# Patient Record
Sex: Female | Born: 1965 | Hispanic: Yes | Marital: Single
Health system: Southern US, Community
[De-identification: ages and names within clinical notes are randomized; demographics above are authoritative.]

## PROBLEM LIST (undated history)

## (undated) HISTORY — PX: ABDOMINAL HYSTERECTOMY: SHX81

---

## 2008-02-14 ENCOUNTER — Ambulatory Visit: Payer: Self-pay

## 2009-04-06 ENCOUNTER — Emergency Department: Payer: Self-pay | Admitting: Internal Medicine

## 2009-04-12 ENCOUNTER — Ambulatory Visit: Payer: Self-pay | Admitting: Obstetrics and Gynecology

## 2009-04-15 ENCOUNTER — Ambulatory Visit: Payer: Self-pay | Admitting: Obstetrics and Gynecology

## 2020-03-28 ENCOUNTER — Emergency Department: Payer: Self-pay

## 2020-03-28 ENCOUNTER — Emergency Department
Admission: EM | Admit: 2020-03-28 | Discharge: 2020-03-29 | Disposition: A | Discharge status: Home / Self Care | Payer: Self-pay | Attending: Emergency Medicine | Admitting: Emergency Medicine

## 2020-03-28 ENCOUNTER — Other Ambulatory Visit: Payer: Self-pay

## 2020-03-28 DIAGNOSIS — N390 Urinary tract infection, site not specified: Secondary | ICD-10-CM | POA: Insufficient documentation

## 2020-03-28 LAB — CBC WITH DIFFERENTIAL/PLATELET
Abs Immature Granulocytes: 0.06 10*3/uL (ref 0.00–0.07)
Basophils Absolute: 0 10*3/uL (ref 0.0–0.1)
Basophils Relative: 0 %
Eosinophils Absolute: 0 10*3/uL (ref 0.0–0.5)
Eosinophils Relative: 0 %
HCT: 37.3 % (ref 36.0–46.0)
Hemoglobin: 12.3 g/dL (ref 12.0–15.0)
Immature Granulocytes: 0 %
Lymphocytes Relative: 6 %
Lymphs Abs: 0.9 10*3/uL (ref 0.7–4.0)
MCH: 28 pg (ref 26.0–34.0)
MCHC: 33 g/dL (ref 30.0–36.0)
MCV: 84.8 fL (ref 80.0–100.0)
Monocytes Absolute: 1.3 10*3/uL — ABNORMAL HIGH (ref 0.1–1.0)
Monocytes Relative: 9 %
Neutro Abs: 12.1 10*3/uL — ABNORMAL HIGH (ref 1.7–7.7)
Neutrophils Relative %: 85 %
Platelets: 288 10*3/uL (ref 150–400)
RBC: 4.4 MIL/uL (ref 3.87–5.11)
RDW: 13.1 % (ref 11.5–15.5)
WBC: 14.5 10*3/uL — ABNORMAL HIGH (ref 4.0–10.5)
nRBC: 0 % (ref 0.0–0.2)

## 2020-03-28 LAB — URINALYSIS, COMPLETE (UACMP) WITH MICROSCOPIC
Bacteria, UA: NONE SEEN
Bilirubin Urine: NEGATIVE
Glucose, UA: NEGATIVE mg/dL
Ketones, ur: NEGATIVE mg/dL
Nitrite: NEGATIVE
Protein, ur: 100 mg/dL — AB
Specific Gravity, Urine: 1.013 (ref 1.005–1.030)
Squamous Epithelial / HPF: >50 — ABNORMAL HIGH (ref 0–5)
WBC, UA: >50 WBC/hpf — ABNORMAL HIGH (ref 0–5)
pH: 7 (ref 5.0–8.0)

## 2020-03-28 LAB — COMPREHENSIVE METABOLIC PANEL
ALT: 44 U/L (ref 0–44)
AST: 40 U/L (ref 15–41)
Albumin: 3.4 g/dL — ABNORMAL LOW (ref 3.5–5.0)
Alkaline Phosphatase: 82 U/L (ref 38–126)
Anion gap: 12 (ref 5–15)
BUN: 15 mg/dL (ref 6–20)
CO2: 23 mmol/L (ref 22–32)
Calcium: 8.4 mg/dL — ABNORMAL LOW (ref 8.9–10.3)
Chloride: 101 mmol/L (ref 98–111)
Creatinine, Ser: 1.39 mg/dL — ABNORMAL HIGH (ref 0.44–1.00)
GFR, Estimated: 45 mL/min — ABNORMAL LOW (ref >60)
Glucose, Bld: 117 mg/dL — ABNORMAL HIGH (ref 70–99)
Potassium: 3.3 mmol/L — ABNORMAL LOW (ref 3.5–5.1)
Sodium: 136 mmol/L (ref 135–145)
Total Bilirubin: 1 mg/dL (ref 0.3–1.2)
Total Protein: 8.2 g/dL — ABNORMAL HIGH (ref 6.5–8.1)

## 2020-03-28 LAB — LACTIC ACID, PLASMA: Lactic Acid, Venous: 1 mmol/L (ref 0.5–1.9)

## 2020-03-28 MED ORDER — ACETAMINOPHEN 500 MG PO TABS
1000.0000 mg | ORAL_TABLET | Freq: Once | ORAL | Status: AC
  Administered 2020-03-28: 1000 mg via ORAL
  Filled 2020-03-28: qty 2

## 2020-03-28 MED ORDER — SODIUM CHLORIDE 0.9 % IV SOLN
1.0000 g | Freq: Once | INTRAVENOUS | Status: AC
  Administered 2020-03-28: 1 g via INTRAVENOUS
  Filled 2020-03-28: qty 10

## 2020-03-28 MED ORDER — ONDANSETRON 4 MG PO TBDP
4.0000 mg | ORAL_TABLET | Freq: Once | ORAL | Status: AC
  Administered 2020-03-28: 4 mg via ORAL

## 2020-03-28 MED ORDER — ONDANSETRON 4 MG PO TBDP
ORAL_TABLET | ORAL | Status: AC
  Filled 2020-03-28: qty 1

## 2020-03-28 MED ORDER — IBUPROFEN 600 MG PO TABS
600.0000 mg | ORAL_TABLET | Freq: Once | ORAL | Status: AC
  Administered 2020-03-28: 600 mg via ORAL
  Filled 2020-03-28: qty 1

## 2020-03-28 NOTE — ED Provider Notes (Signed)
[facility] Emergency Department Provider Note  Time seen: 11:16 PM  I have reviewed the triage vital signs and the nursing notes. Spanish interpreter used for this evaluation.  HISTORY  Chief Complaint Fever   HPI Mary Salas is a 54 y.o. female with no significant past medical history presents to the emergency department for 5 days of fever and not feeling well.  According to the patient she was diagnosed with Covid approximately 1 month ago, states she felt better after she recovered and took a test approximately 2 weeks later and was negative.  Patient states her only symptom she still has is an occasional dry cough.  Denies any chest pain.  No shortness of breath.  However approximately 5 days ago she began feeling nauseated with intermittent high fever at home.  Patient has noticed dark urine but denies any dysuria.  States nausea with occasional vomiting but denies any diarrhea.  No abdominal pain.   No past medical history on file.  There are no problems to display for this patient.   Prior to Admission medications   Not on File    No Known Allergies  No family history on file.  Social History Social History   Tobacco Use  . Smoking status: Not on file  Substance Use Topics  . Alcohol use: Not on file  . Drug use: Not on file    Review of Systems Constitutional: Intermittent fever over the past 5 days ENT: Negative for recent illness/congestion Cardiovascular: Negative for chest pain. Respiratory: Negative for shortness of breath.  Occasional dry cough x1 month Gastrointestinal: Negative for abdominal pain.  Positive for nausea occasional vomiting. Genitourinary: Dark urine without dysuria. Musculoskeletal: Negative for musculoskeletal complaints Neurological: Negative for headache All other ROS negative  ____________________________________________   PHYSICAL EXAM:  VITAL SIGNS: ED Triage Vitals [03/28/20 1953]  Enc  Vitals Group     BP (!) 156/102     Pulse Rate (!) 124     Resp 16     Temp (!) 100.6 F (38.1 C)     Temp Source Oral     SpO2 97 %     Weight      Height      Head Circumference      Peak Flow      Pain Score 0     Pain Loc      Pain Edu?      Excl. in GC?    Constitutional: Alert and oriented. Well appearing and in no distress. Eyes: Normal exam ENT      Head: Normocephalic and atraumatic.      Mouth/Throat: Mucous membranes are moist. Cardiovascular: Normal rate, regular rhythm Respiratory: Normal respiratory effort without tachypnea nor retractions. Breath sounds are clear Gastrointestinal: Soft and nontender. No distention.  There is no CVA tenderness. Musculoskeletal: Nontender with normal range of motion in all extremities.  Neurologic:  Normal speech and language. No gross focal neurologic deficits Skin:  Skin is warm, dry and intact.  Psychiatric: Mood and affect are normal.  ____________________________________________    EKG  EKG viewed and interpreted by myself shows sinus tachycardia 110 bpm with a narrow QRS, normal axis, normal intervals, no concerning ST changes.  ____________________________________________    RADIOLOGY  Chest x-ray shows bilateral opacities consistent with Covid  ____________________________________________   INITIAL IMPRESSION / ASSESSMENT AND PLAN / ED COURSE  Pertinent labs & imaging results that were available during my care of the patient were reviewed  by me and considered in my medical decision making (see chart for details).   Patient presents emergency department for fever generalized weakness occasional nausea vomiting.  Overall the patient appears quite well.  She is tachycardic and febrile.  Patient appears to have a fairly significant urinary tract infection on her lab work.  Chest x-ray continues to show bilateral opacities however given her recent Covid infection this is to be expected.  As the patient improved from  Covid over the past 2 weeks and is now having fever body aches and a significant urinary tract infection on her work-up I believe her current illness is more likely due to UTI than Covid.  Patient is febrile and mildly tachycardic we will dose Tylenol and reassess.  We will dose IV fluids.  I have sent urine culture we will dose IV Rocephin and reassess at that time.  Reassuringly patient's lactic acid is 1.  Patient states she is feeling much better after fluids and antibiotics as well as Tylenol.  Daughter is here with the patient as well.  I offered the patient admission to the hospital.  She would rather go home.  I believe this is a reasonable option as well we will discharge with antibiotics I discussed using Tylenol/ibuprofen every 6 hours for fever as well as plenty of fluids and rest.  I also discussed very strict return precautions if she is to worsen or begin vomiting unable to keep down antibiotics.  Patient agreeable to plan of care.  Mary Salas was evaluated in Emergency Department on 03/28/2020 for the symptoms described in the history of present illness. She was evaluated in the context of the global COVID-19 pandemic, which necessitated consideration that the patient might be at risk for infection with the SARS-CoV-2 virus that causes COVID-19. Institutional protocols and algorithms that pertain to the evaluation of patients at risk for COVID-19 are in a state of rapid change based on information released by regulatory bodies including the CDC and federal and state organizations. These policies and algorithms were followed during the patient's care in the ED.  ____________________________________________   FINAL CLINICAL IMPRESSION(S) / ED DIAGNOSES  fever Urinary tract infection   Minna Antis, MD 03/29/20 [id number]

## 2020-03-28 NOTE — ED Notes (Signed)
Pt states she has had a fever and nausea/vomiting for five days. Pt states she's been taking tylenol at home and has still had chills. Denies pain.

## 2020-03-28 NOTE — ED Triage Notes (Addendum)
Pt to ED reporting fever starting 5 days ago with generalized body aches. Nausea without vomiting or diarrhea. No urinary changes. Nonproductive cough x 5 days as well. No SOB or chest pain.  Temp at home reportedly 99.3, pt took tylenol at 1500 today.  Pt tested positive for COVID 4 weeks ago.

## 2020-03-29 MED ORDER — CEPHALEXIN 500 MG PO CAPS: 500.0000 mg | ORAL_CAPSULE | Freq: Three times a day (TID) | ORAL | 0 refills | Status: AC

## 2020-03-29 NOTE — Discharge Instructions (Signed)
Please take antibiotics as prescribed for their entire course.  Please use Tylenol or ibuprofen every 6 hours as needed for fever or discomfort.  Return to the emergency department if you are unable to keep down your antibiotics, develop abdominal pain, or if your symptoms appear to worsen.  Otherwise please follow-up with your primary care doctor in the next 2 to 3 days for recheck/reevaluation.

## 2020-03-30 LAB — URINE CULTURE: Culture: 70000 — AB

## 2020-07-31 ENCOUNTER — Other Ambulatory Visit: Payer: Self-pay

## 2020-07-31 ENCOUNTER — Ambulatory Visit: Payer: Self-pay | Attending: Oncology

## 2020-07-31 ENCOUNTER — Ambulatory Visit
Admission: RE | Admit: 2020-07-31 | Discharge: 2020-07-31 | Disposition: A | Discharge status: Home / Self Care | Payer: Self-pay | Source: Ambulatory Visit | Attending: Oncology | Admitting: Oncology

## 2020-07-31 VITALS — BP 152/97 | HR 103 | Temp 97.8°F | Ht 60.63 in | Wt 175.8 lb

## 2020-07-31 DIAGNOSIS — Z0000 Encounter for general adult medical examination without abnormal findings: Secondary | ICD-10-CM

## 2020-07-31 NOTE — Progress Notes (Signed)
  Subjective:     Patient ID: Mary Salas, female   DOB: [birth year], 55 y.o.   MRN: [id number]  HPI   Review of Systems     Objective:   Physical Exam Chest:  Breasts:     Right: No swelling, bleeding, inverted nipple, mass, nipple discharge, skin change or tenderness.     Left: No swelling, bleeding, inverted nipple, mass, nipple discharge, skin change or tenderness.          Assessment:     55 year old patient presents for [facility] clinic visit. Partial hysterectomy in 2010.  Ovaries intact.  Patient screened, and meets [facility] eligibility.  Patient does not have insurance, Medicare or Medicaid. Instructed patient on breast self awareness using teach back method.  Clinical breast exam unremarkable.  No mass or lump palpated.   Risk Assessment    Risk Scores      07/31/2020   Last edited by: Jim Like, RN   5-year risk: 0.8 %   Lifetime risk: 6.6 %            Plan:     Sent for bilateral screening mammogram.

## 2020-10-14 NOTE — Progress Notes (Signed)
Letter mailed from [facility] to notify of normal mammogram results.  Patient to return in one year for annual screening.  Copy to [facility]. 

## 2022-07-18 IMAGING — MG MM DIGITAL SCREENING BILAT W/ TOMO AND CAD
8 series · 8 of 24 positions shown · non-contrast
Comparison: Previous exam(s).

CLINICAL DATA: Screening.

EXAM:
DIGITAL SCREENING BILATERAL MAMMOGRAM WITH TOMOSYNTHESIS AND CAD
TECHNIQUE: Bilateral screening digital craniocaudal and mediolateral oblique
mammograms were obtained. Bilateral screening digital breast
tomosynthesis was performed. The images were evaluated with
computer-aided detection.

[R CC synth-2D]
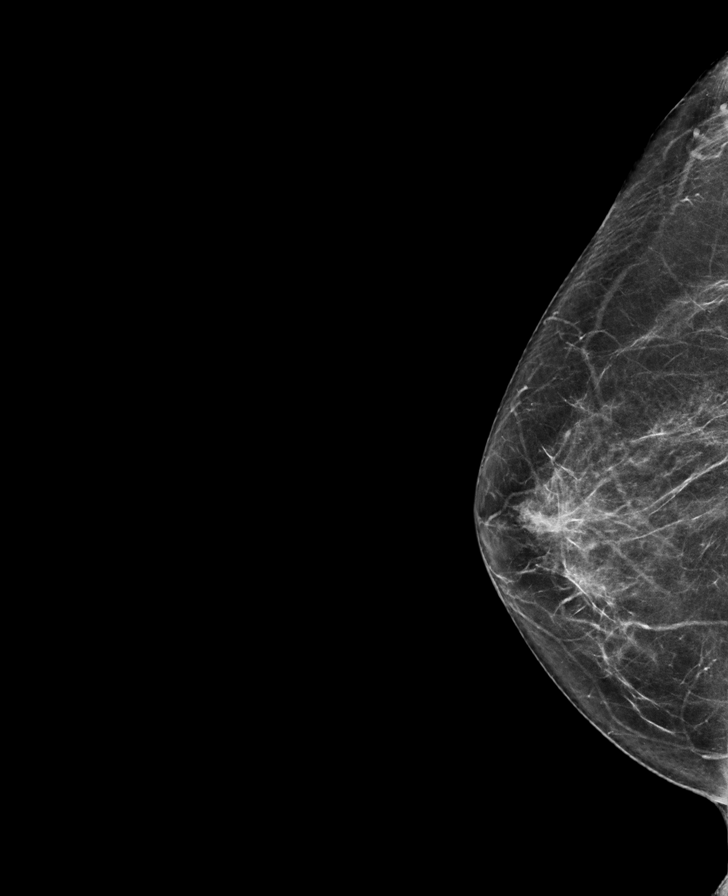

[R MLO synth-2D]
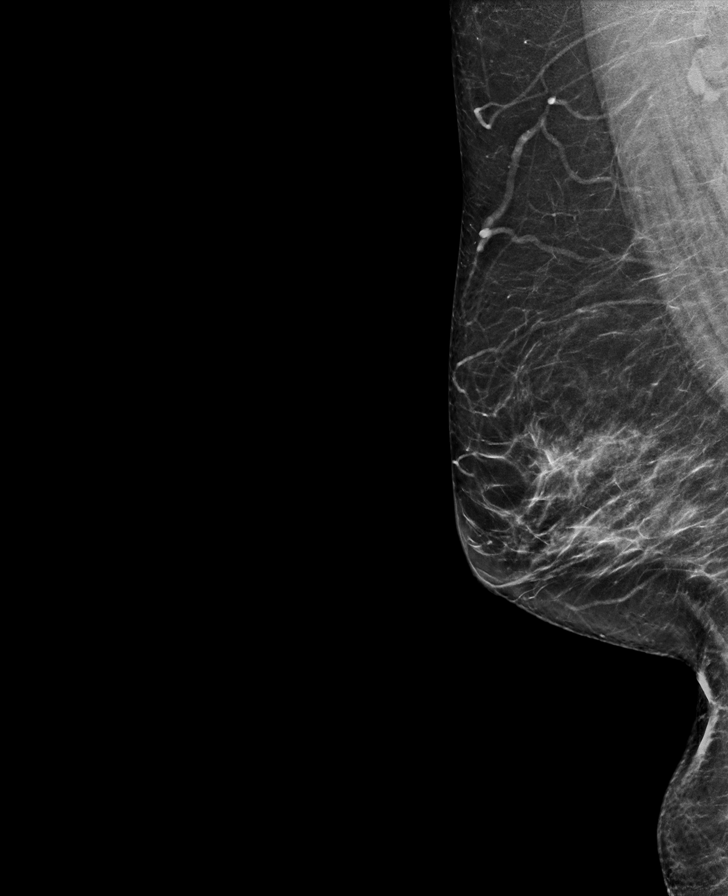

[L MLO synth-2D]
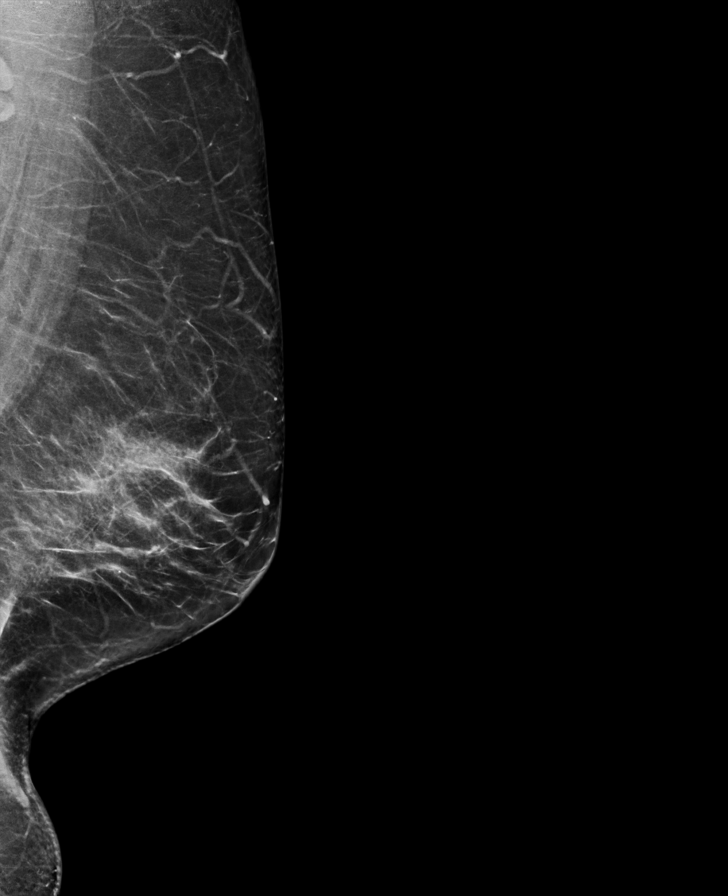

[L CC synth-2D]
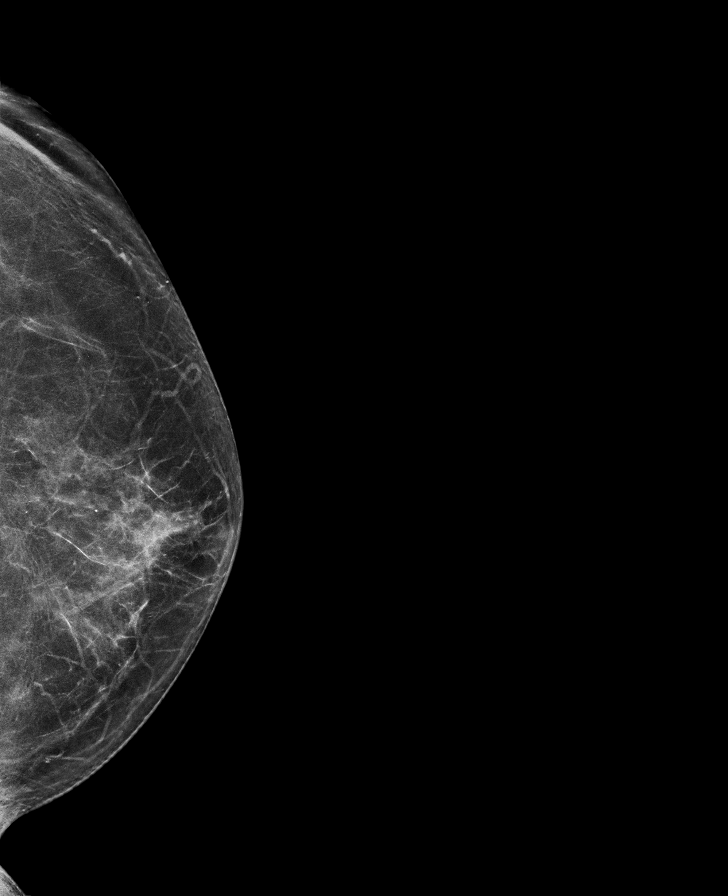

[R CC tomo · tomo slice 35/69.0]
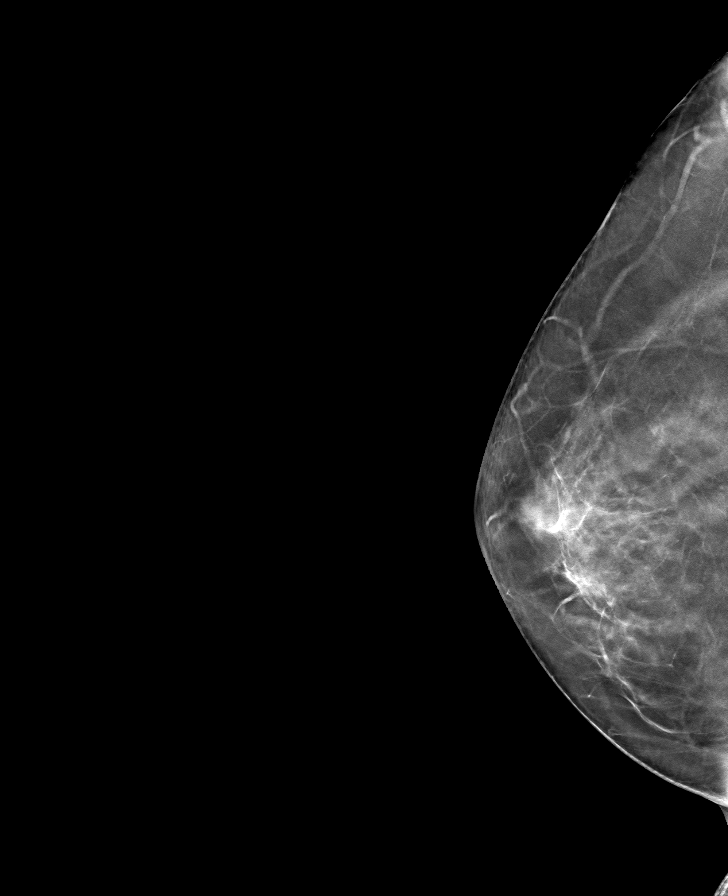

[L CC tomo · tomo slice 35/68.0]
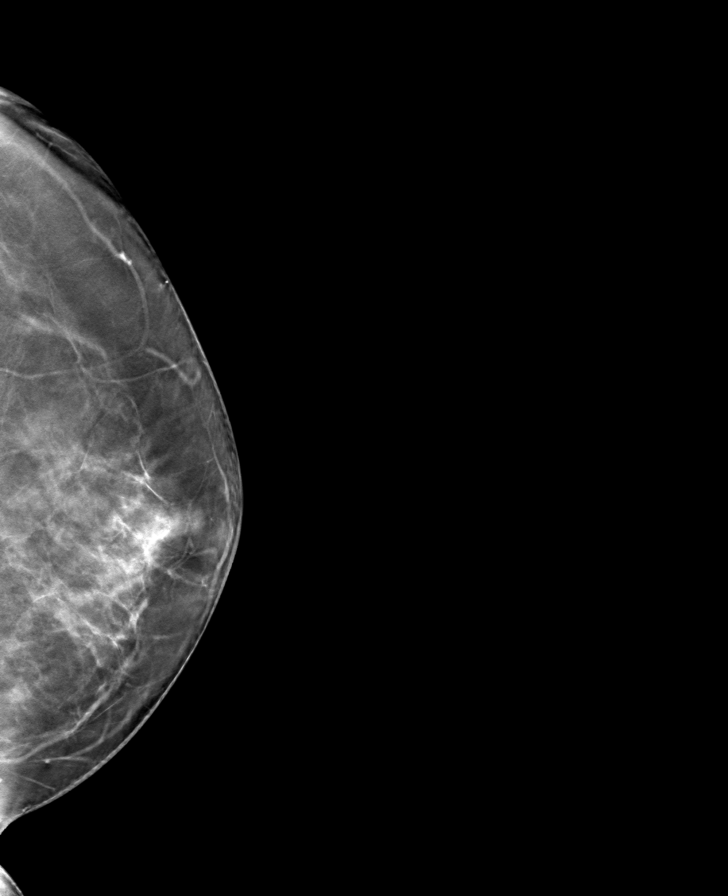

[L MLO tomo · tomo slice 33/65.0]
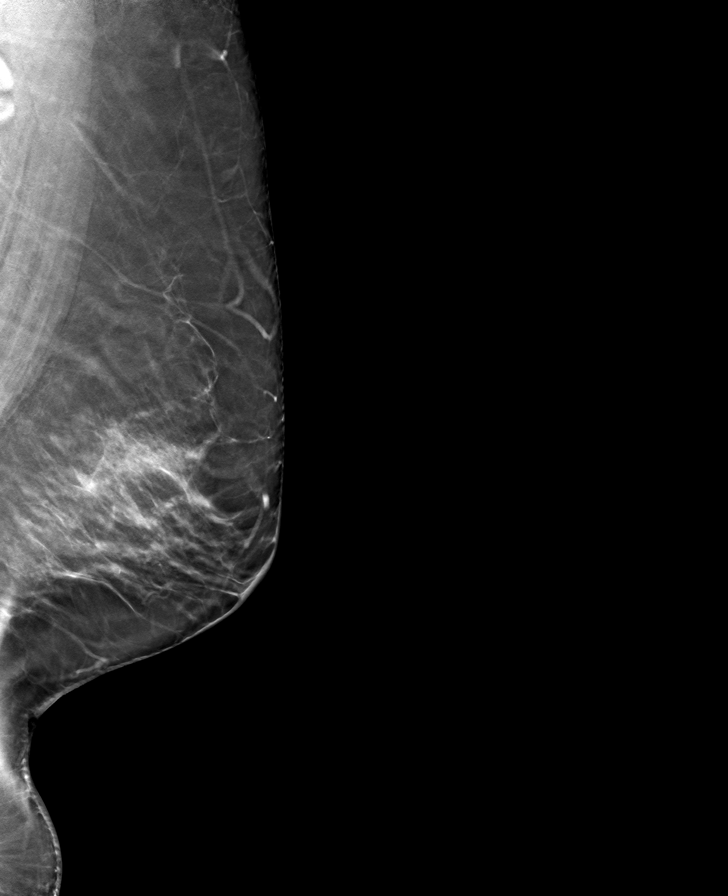

[R MLO tomo · tomo slice 32/63.0]
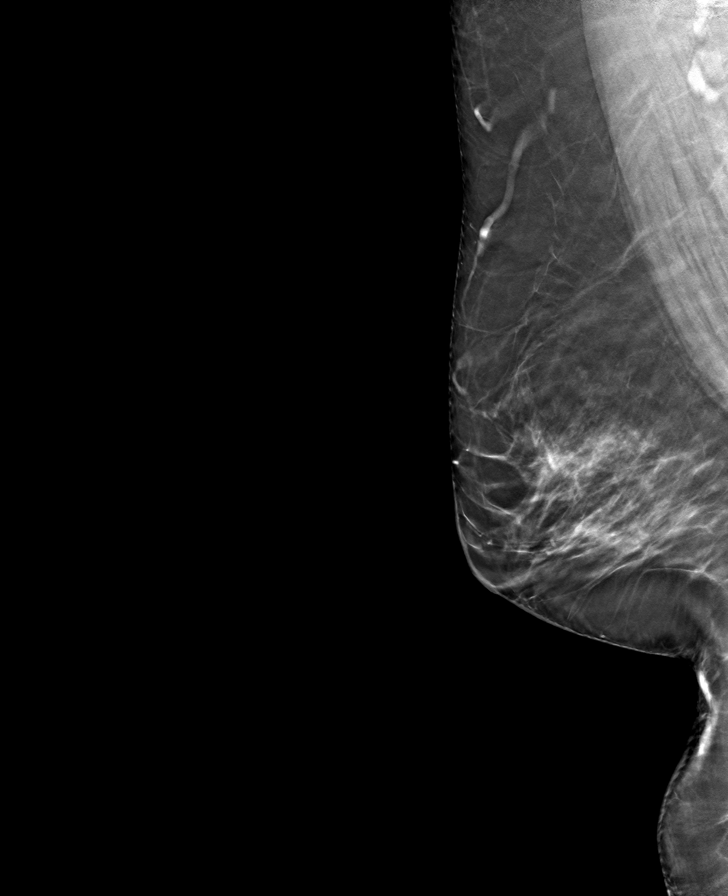

[8 of 24 positions shown; findings below may reference images not displayed]

ACR Breast Density Category b: There are scattered areas of
fibroglandular density.
FINDINGS: There are no findings suspicious for malignancy. The images were
evaluated with computer-aided detection.
IMPRESSION: No mammographic evidence of malignancy. A result letter of this
screening mammogram will be mailed directly to the patient.

RECOMMENDATION:
Screening mammogram in one year. (Code:WJ-[id number])

BI-RADS CATEGORY  1: Negative.
